# Patient Record
Sex: Female | Born: 2000 | Race: White | Marital: Single | Smoking: Never smoker
Health system: Southern US, Community
[De-identification: ages and names within clinical notes are randomized; demographics above are authoritative.]

## PROBLEM LIST (undated history)

## (undated) DIAGNOSIS — F419 Anxiety disorder, unspecified: Secondary | ICD-10-CM

## (undated) DIAGNOSIS — F4310 Post-traumatic stress disorder, unspecified: Secondary | ICD-10-CM

## (undated) DIAGNOSIS — G43909 Migraine, unspecified, not intractable, without status migrainosus: Secondary | ICD-10-CM

## (undated) HISTORY — DX: Migraine, unspecified, not intractable, without status migrainosus: G43.909

## (undated) HISTORY — DX: Post-traumatic stress disorder, unspecified: F43.10

## (undated) HISTORY — DX: Anxiety disorder, unspecified: F41.9

## (undated) HISTORY — PX: NO PAST SURGERIES: SHX2092

---
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
## 2019-05-31 DIAGNOSIS — L738 Other specified follicular disorders: Secondary | ICD-10-CM | POA: Diagnosis not present

## 2019-05-31 DIAGNOSIS — L700 Acne vulgaris: Secondary | ICD-10-CM | POA: Diagnosis not present

## 2019-08-06 DIAGNOSIS — M542 Cervicalgia: Secondary | ICD-10-CM | POA: Diagnosis not present

## 2019-08-29 DIAGNOSIS — Z23 Encounter for immunization: Secondary | ICD-10-CM | POA: Diagnosis not present

## 2019-08-29 DIAGNOSIS — Z79899 Other long term (current) drug therapy: Secondary | ICD-10-CM | POA: Diagnosis not present

## 2019-08-29 DIAGNOSIS — L700 Acne vulgaris: Secondary | ICD-10-CM | POA: Diagnosis not present

## 2019-09-30 DIAGNOSIS — L700 Acne vulgaris: Secondary | ICD-10-CM | POA: Diagnosis not present

## 2019-09-30 DIAGNOSIS — Z79899 Other long term (current) drug therapy: Secondary | ICD-10-CM | POA: Diagnosis not present

## 2019-10-31 DIAGNOSIS — Z03818 Encounter for observation for suspected exposure to other biological agents ruled out: Secondary | ICD-10-CM | POA: Diagnosis not present

## 2019-10-31 DIAGNOSIS — Z20828 Contact with and (suspected) exposure to other viral communicable diseases: Secondary | ICD-10-CM | POA: Diagnosis not present

## 2019-11-04 DIAGNOSIS — L700 Acne vulgaris: Secondary | ICD-10-CM | POA: Diagnosis not present

## 2019-11-04 DIAGNOSIS — Z79899 Other long term (current) drug therapy: Secondary | ICD-10-CM | POA: Diagnosis not present

## 2019-12-09 DIAGNOSIS — Z79899 Other long term (current) drug therapy: Secondary | ICD-10-CM | POA: Diagnosis not present

## 2019-12-09 DIAGNOSIS — L700 Acne vulgaris: Secondary | ICD-10-CM | POA: Diagnosis not present

## 2019-12-09 DIAGNOSIS — L308 Other specified dermatitis: Secondary | ICD-10-CM | POA: Diagnosis not present

## 2020-01-13 ENCOUNTER — Ambulatory Visit: Payer: BC Managed Care – PPO | Attending: Internal Medicine

## 2020-01-13 DIAGNOSIS — Z79899 Other long term (current) drug therapy: Secondary | ICD-10-CM | POA: Diagnosis not present

## 2020-01-13 DIAGNOSIS — Z23 Encounter for immunization: Secondary | ICD-10-CM

## 2020-01-13 DIAGNOSIS — L700 Acne vulgaris: Secondary | ICD-10-CM | POA: Diagnosis not present

## 2020-01-13 NOTE — Progress Notes (Signed)
   Covid-19 Vaccination Clinic  Name:  Nicole Caldwell    MRN: [id number] DOB: 2000-11-16  01/13/2020  Ms. Nicole Caldwell was observed post Covid-19 immunization for 15 minutes without incident. She was provided with Vaccine Information Sheet and instruction to access the V-Safe system.   Ms. Nicole Caldwell was instructed to call 911 with any severe reactions post vaccine: Marland Kitchen Difficulty breathing  . Swelling of face and throat  . A fast heartbeat  . A bad rash all over body  . Dizziness and weakness   Immunizations Administered    Name Date Dose VIS Date Route   Pfizer COVID-19 Vaccine 01/13/2020  1:38 PM 0.3 mL 09/27/2019 Intramuscular   Manufacturer: [employer], [employer]   Lot: [id number]   NDC: [address]-8

## 2020-01-21 DIAGNOSIS — Z0000 Encounter for general adult medical examination without abnormal findings: Secondary | ICD-10-CM | POA: Diagnosis not present

## 2020-01-21 DIAGNOSIS — Z13220 Encounter for screening for lipoid disorders: Secondary | ICD-10-CM | POA: Diagnosis not present

## 2020-02-04 ENCOUNTER — Ambulatory Visit: Payer: BC Managed Care – PPO | Attending: Internal Medicine

## 2020-02-04 DIAGNOSIS — Z23 Encounter for immunization: Secondary | ICD-10-CM

## 2020-02-04 NOTE — Progress Notes (Signed)
   Covid-19 Vaccination Clinic  Name:  Nicole Caldwell    MRN: [id number] DOB: [birth year]  02/04/2020  Ms. Nicole Caldwell was observed post Covid-19 immunization for 15 minutes without incident. She was provided with Vaccine Information Sheet and instruction to access the V-Safe system.   Ms. Nicole Caldwell was instructed to call 911 with any severe reactions post vaccine: Marland Kitchen Difficulty breathing  . Swelling of face and throat  . A fast heartbeat  . A bad rash all over body  . Dizziness and weakness   Immunizations Administered    Name Date Dose VIS Date Route   Pfizer COVID-19 Vaccine 02/04/2020  2:18 PM 0.3 mL 12/11/2018 Intramuscular   Manufacturer: [employer], [employer]   Lot: [id number]   NDC: [address]-7

## 2020-02-17 DIAGNOSIS — Z79899 Other long term (current) drug therapy: Secondary | ICD-10-CM | POA: Diagnosis not present

## 2020-02-17 DIAGNOSIS — L700 Acne vulgaris: Secondary | ICD-10-CM | POA: Diagnosis not present

## 2020-03-19 DIAGNOSIS — Z79899 Other long term (current) drug therapy: Secondary | ICD-10-CM | POA: Diagnosis not present

## 2020-03-19 DIAGNOSIS — L700 Acne vulgaris: Secondary | ICD-10-CM | POA: Diagnosis not present

## 2020-04-21 DIAGNOSIS — Z79899 Other long term (current) drug therapy: Secondary | ICD-10-CM | POA: Diagnosis not present

## 2020-04-21 DIAGNOSIS — L700 Acne vulgaris: Secondary | ICD-10-CM | POA: Diagnosis not present

## 2020-05-21 DIAGNOSIS — L700 Acne vulgaris: Secondary | ICD-10-CM | POA: Diagnosis not present

## 2020-05-25 DIAGNOSIS — Z113 Encounter for screening for infections with a predominantly sexual mode of transmission: Secondary | ICD-10-CM | POA: Diagnosis not present

## 2020-05-25 DIAGNOSIS — Z6821 Body mass index (BMI) 21.0-21.9, adult: Secondary | ICD-10-CM | POA: Diagnosis not present

## 2020-05-25 DIAGNOSIS — N926 Irregular menstruation, unspecified: Secondary | ICD-10-CM | POA: Diagnosis not present

## 2020-05-25 DIAGNOSIS — Z01419 Encounter for gynecological examination (general) (routine) without abnormal findings: Secondary | ICD-10-CM | POA: Diagnosis not present

## 2020-06-02 DIAGNOSIS — Z3202 Encounter for pregnancy test, result negative: Secondary | ICD-10-CM | POA: Diagnosis not present

## 2020-06-02 DIAGNOSIS — Z3046 Encounter for surveillance of implantable subdermal contraceptive: Secondary | ICD-10-CM | POA: Diagnosis not present

## 2020-06-04 ENCOUNTER — Other Ambulatory Visit: Payer: Self-pay

## 2020-06-04 ENCOUNTER — Emergency Department (HOSPITAL_COMMUNITY)
Admission: EM | Admit: 2020-06-04 | Discharge: 2020-06-04 | Disposition: A | Discharge status: Home / Self Care | Payer: BC Managed Care – PPO | Attending: Emergency Medicine | Admitting: Emergency Medicine

## 2020-06-04 ENCOUNTER — Encounter (HOSPITAL_COMMUNITY): Payer: Self-pay | Admitting: Emergency Medicine

## 2020-06-04 DIAGNOSIS — R110 Nausea: Secondary | ICD-10-CM | POA: Insufficient documentation

## 2020-06-04 DIAGNOSIS — I498 Other specified cardiac arrhythmias: Secondary | ICD-10-CM | POA: Diagnosis not present

## 2020-06-04 DIAGNOSIS — R42 Dizziness and giddiness: Secondary | ICD-10-CM | POA: Diagnosis not present

## 2020-06-04 DIAGNOSIS — R001 Bradycardia, unspecified: Secondary | ICD-10-CM | POA: Diagnosis not present

## 2020-06-04 MED ORDER — MECLIZINE HCL 25 MG PO TABS: 25.0000 mg | ORAL_TABLET | Freq: Three times a day (TID) | ORAL | 0 refills | Status: AC | PRN

## 2020-06-04 MED ORDER — MECLIZINE HCL 25 MG PO TABS
50.0000 mg | ORAL_TABLET | Freq: Once | ORAL | Status: AC
  Administered 2020-06-04: 50 mg via ORAL
  Filled 2020-06-04: qty 2

## 2020-06-04 NOTE — ED Triage Notes (Signed)
Patient reports sent from [facility] for evaluation of nystagmus. States dizziness upon standing and ambulating this morning with nausea. States nexplanon replaced x2 days ago. Denies head injury and headache.

## 2020-06-04 NOTE — ED Provider Notes (Signed)
[facility]-EMERGENCY DEPT Provider Note   CSN: [id number] Arrival date & time: 06/04/20  1132     History Chief Complaint  Patient presents with  . Dizziness    Genavie Johnanna Schneiders is a 19 y.o. female.  HPI Patient is 19 year old female presented today with sudden onset of intermittent dizziness that occurs worse when she looks left began this morning when she got out of bed.  She states that she does have associated nausea when she is experiencing the dizziness.  She describes a sensation as the room spinning.  She states that she has had no episodes of vomiting no abdominal pain no headache lightheadedness or near syncope.  She states that she has had no head trauma denies any vision changes.  Per her mother who is at bedside patient was seen at urgent care earlier today and was sent to emergency department for further evaluation.  Per mother she was sent because of some horizontal nystagmus that was noted on exam.  Per mother patient also has chronic sinus issues.  Patient denies any focal weakness.  Mother denies any slurred speech or confusion.     History reviewed. No pertinent past medical history.  There are no problems to display for this patient.   History reviewed. No pertinent surgical history.   OB History   No obstetric history on file.     No family history on file.  Social History   Tobacco Use  . Smoking status: Not on file  Substance Use Topics  . Alcohol use: Not on file  . Drug use: Not on file    Home Medications Prior to Admission medications   Medication Sig Start Date End Date Taking? Authorizing Provider  meclizine (ANTIVERT) 25 MG tablet Take 1 tablet (25 mg total) by mouth 3 (three) times daily as needed for dizziness. 06/04/20   Gailen Shelter, PA    Allergies    Penicillins  Review of Systems   Review of Systems  Constitutional: Negative for chills and fever.  HENT: Negative for congestion.   Eyes:  Negative for pain.  Respiratory: Negative for cough and shortness of breath.   Cardiovascular: Negative for chest pain and leg swelling.  Gastrointestinal: Positive for nausea. Negative for abdominal pain, diarrhea and vomiting.  Genitourinary: Negative for dysuria.  Musculoskeletal: Negative for myalgias.  Skin: Negative for rash.  Neurological: Positive for dizziness. Negative for syncope, light-headedness, numbness and headaches.    Physical Exam Updated Vital Signs BP 112/72 (BP Location: Right Arm)   Pulse 82   Temp 98.6 F (37 C) (Oral)   Resp 18   Ht 5\' 4"  (1.626 m)   Wt 52.4 kg   LMP 05/14/2020   SpO2 100%   BMI 19.84 kg/m   Physical Exam Vitals and nursing note reviewed.  Constitutional:      General: She is not in acute distress.    Comments: Pleasant 19 year old female appears stated age.  In no acute distress.  Sitting comfortably in bed  HENT:     Head: Normocephalic and atraumatic.     Nose: Nose normal.  Eyes:     General: No scleral icterus.    Comments: Faint left unilateral horizontal nystagmus  Cardiovascular:     Rate and Rhythm: Normal rate and regular rhythm.     Pulses: Normal pulses.     Heart sounds: Normal heart sounds.  Pulmonary:     Effort: Pulmonary effort is normal. No respiratory distress.  Breath sounds: No wheezing.  Abdominal:     Palpations: Abdomen is soft.     Tenderness: There is no abdominal tenderness.  Musculoskeletal:     Cervical back: Normal range of motion.     Right lower leg: No edema.     Left lower leg: No edema.  Skin:    General: Skin is warm and dry.     Capillary Refill: Capillary refill takes less than 2 seconds.  Neurological:     Mental Status: She is alert. Mental status is at baseline.     Comments: Alert and oriented to self, place, time and event.   Speech is fluent, clear without dysarthria or dysphasia.   Strength 5/5 in upper/lower extremities  Sensation intact in upper/lower extremities    Normal gait.  Negative Romberg. No pronator drift.  Normal finger-to-nose and feet tapping.  CN I not tested  CN II grossly intact visual fields bilaterally. Did not visualize posterior eye.   CN III, IV, VI PERRLA and EOMs intact bilaterally, mild leftward nystagmus with left gaze deviation.  No other abnormalities. CN V Intact sensation to sharp and light touch to the face  CN VII facial movements symmetric  CN VIII not tested  CN IX, X no uvula deviation, symmetric rise of soft palate  CN XI 5/5 SCM and trapezius strength bilaterally  CN XII Midline tongue protrusion, symmetric L/R movements   Psychiatric:        Mood and Affect: Mood normal.        Behavior: Behavior normal.     ED Results / Procedures / Treatments   Labs (all labs ordered are listed, but only abnormal results are displayed) Labs Reviewed - No data to display  EKG None  Radiology No results found.  Procedures Procedures (including critical care time)  Medications Ordered in ED Medications  meclizine (ANTIVERT) tablet 50 mg (50 mg Oral Given 06/04/20 1222)    ED Course  I have reviewed the triage vital signs and the nursing notes.  Pertinent labs & imaging results that were available during my care of the patient were reviewed by me and considered in my medical decision making (see chart for details).    MDM Rules/Calculators/A&P                          Patient is 19 year old female with no pertinent past medical history presented today for chief complaint of vertiginous symptoms.  She states that it came on abruptly this morning and is worsened by leftward gaze and states that she has some associated nausea.  She states she is otherwise feeling well.  She was sent from urgent care today over concerns for her dizziness.  She was noted by urgent care provider to have nystagmus.  Left physical exam is notable for mild unilateral horizontal leftward beating nystagmus with left gaze otherwise  normal exam.  She is well-appearing has extraocular movements that are intact and has minimal symptoms currently.  She states that they are much worse with looking left.  She is neurologically intact and vital signs within normal limits.  She has no evidence of lightheadedness or near syncope therefore I have very low suspicion for cardiac contrast or arrhythmia.  I have very low suspicion for stroke in this 19 year old female as she is healthy has no pertinent risk factors for this.  She does have some family history of stroke however given her symptoms are very consistent with BPPV  we will treat empirically with meclizine and discharged with same.  Will recommend follow-up with ear nose and throat.  She is given strict return precautions for any new or concerning symptoms including any neurologic abnormalities.  I have shared decision making her CHF mother and patient and they are agreeable to this plan.  Prefer not to stay for additional testing/imaging.  This is reasonable at this time.  --   The medical records were personally reviewed by myself. I personally reviewed all lab results and interpreted all imaging studies and either concurred with their official read or contacted radiology for clarification. Additional history obtained from old records/EMS/family members  This patient appears reasonably screened and I doubt any other medical condition requiring further workup, evaluation, or treatment in the ED at this time prior to discharge.   Patient's vitals are WNL apart from vital sign abnormalities discussed above, patient is in NAD, and able to ambulate in the ED at their baseline and able to tolerate PO.  Pain has been managed or a plan has been made for home management and has no complaints prior to discharge. Patient is comfortable with above plan and for discharge at this time. All questions were answered prior to disposition. Results from the ER workup discussed with the patient face to  face and all questions answered to the best of my ability. The patient is safe for discharge with strict return precautions. Patient appears safe for discharge with appropriate follow-up. Conveyed my impression with the patient and they voiced understanding and are agreeable to plan.   An After Visit Summary was printed and given to the patient.  Portions of this note were generated with Scientist, clinical (histocompatibility and immunogenetics). Dictation errors may occur despite best attempts at proofreading.    Final Clinical Impression(s) / ED Diagnoses Final diagnoses:  Vertigo    Rx / DC Orders ED Discharge Orders         Ordered    meclizine (ANTIVERT) 25 MG tablet  3 times daily PRN        06/04/20 1211           Gailen Shelter, [place] 06/04/20 1539    Jacalyn Lefevre, MD 06/05/20 [phone]

## 2020-06-04 NOTE — ED Notes (Signed)
PA at bedside in triage.

## 2020-06-04 NOTE — Discharge Instructions (Addendum)
You have a reassuringly normal neurologic exam today. 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  The treatment for this is meclizine. Please use as prescribed. I have also given you information on the Epley maneuver. Please attempt this maneuver at home.  Please drink plenty of water.  Continue to monitor your symptoms. If he began having a new or concerning symptoms such as those that we discussed please return immediately to the emergency department for reevaluation.

## 2020-06-04 NOTE — ED Notes (Signed)
Coming from [facility] with complaints of nystagmus and difficulty ambulating

## 2020-07-07 DIAGNOSIS — R42 Dizziness and giddiness: Secondary | ICD-10-CM | POA: Diagnosis not present

## 2020-07-07 DIAGNOSIS — H833X3 Noise effects on inner ear, bilateral: Secondary | ICD-10-CM | POA: Diagnosis not present

## 2020-07-07 DIAGNOSIS — H833X9 Noise effects on inner ear, unspecified ear: Secondary | ICD-10-CM | POA: Insufficient documentation
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
## 2020-09-16 ENCOUNTER — Encounter: Payer: Self-pay | Admitting: Neurology

## 2020-09-16 ENCOUNTER — Ambulatory Visit (INDEPENDENT_AMBULATORY_CARE_PROVIDER_SITE_OTHER): Payer: BC Managed Care – PPO | Admitting: Neurology

## 2020-09-16 ENCOUNTER — Other Ambulatory Visit: Payer: Self-pay

## 2020-09-16 ENCOUNTER — Telehealth: Payer: Self-pay | Admitting: Neurology

## 2020-09-16 VITALS — BP 118/71 | HR 74 | Ht 62.0 in | Wt 122.0 lb

## 2020-09-16 DIAGNOSIS — R42 Dizziness and giddiness: Secondary | ICD-10-CM

## 2020-09-16 DIAGNOSIS — R51 Headache with orthostatic component, not elsewhere classified: Secondary | ICD-10-CM

## 2020-09-16 DIAGNOSIS — R519 Headache, unspecified: Secondary | ICD-10-CM

## 2020-09-16 DIAGNOSIS — H539 Unspecified visual disturbance: Secondary | ICD-10-CM

## 2020-09-16 DIAGNOSIS — G43709 Chronic migraine without aura, not intractable, without status migrainosus: Secondary | ICD-10-CM

## 2020-09-16 DIAGNOSIS — G4484 Primary exertional headache: Secondary | ICD-10-CM

## 2020-09-16 DIAGNOSIS — H93A2 Pulsatile tinnitus, left ear: Secondary | ICD-10-CM

## 2020-09-16 MED ORDER — TOPIRAMATE 25 MG PO TABS: ORAL_TABLET | ORAL | 3 refills | Status: DC

## 2020-09-16 MED ORDER — RIZATRIPTAN BENZOATE 10 MG PO TBDP: 10.0000 mg | ORAL_TABLET | ORAL | 11 refills | Status: AC | PRN

## 2020-09-16 MED ORDER — ONDANSETRON 4 MG PO TBDP: 4.0000 mg | ORAL_TABLET | Freq: Three times a day (TID) | ORAL | 3 refills | Status: AC | PRN

## 2020-09-16 NOTE — Patient Instructions (Addendum)
MRI of the brain and MRA of the head - will call to schedule Blood work today Acute: Rizatriptan:Please take one tablet at the onset of your headache. If it does not improve the symptoms please take one additional tablet. Do not take more then 2 tablets in 24hrs. Do not take use more then 2 to 3 times in a week. Ondansetron for nausea. May take with Rizatriptan as well. Prevention: Topiramate  Topiramate tablets What is this medicine? TOPIRAMATE (toe PYRE a mate) is used to treat seizures in adults or children with epilepsy. It is also used for the prevention of migraine headaches. This medicine may be used for other purposes; ask your health care provider or pharmacist if you have questions. COMMON BRAND NAME(S): Topamax, Topiragen What should I tell my health care provider before I take this medicine? They need to know if you have any of these conditions:  bleeding disorders  kidney disease  lung or breathing disease, like asthma  suicidal thoughts, plans, or attempt; a previous suicide attempt by you or a family member  an unusual or allergic reaction to topiramate, other medicines, foods, dyes, or preservatives  pregnant or trying to get pregnant  breast-feeding How should I use this medicine? Take this medicine by mouth with a glass of water. Follow the directions on the prescription label. Do not cut, crush or chew this medicine. Swallow the tablets whole. You can take it with or without food. If it upsets your stomach, take it with food. Take your medicine at regular intervals. Do not take it more often than directed. Do not stop taking except on your doctor's advice. A special MedGuide will be given to you by the pharmacist with each prescription and refill. Be sure to read this information carefully each time. Talk to your pediatrician regarding the use of this medicine in children. While this drug may be prescribed for children as young as 502 years of age for selected  conditions, precautions do apply. Overdosage: If you think you have taken too much of this medicine contact a poison control center or emergency room at once. NOTE: This medicine is only for you. Do not share this medicine with others. What if I miss a dose? If you miss a dose, take it as soon as you can. If your next dose is to be taken in less than 6 hours, then do not take the missed dose. Take the next dose at your regular time. Do not take double or extra doses. What may interact with this medicine? This medicine may interact with the following medications:  acetazolamide  alcohol  antihistamines for allergy, cough, and cold  aspirin and aspirin-like medicines  atropine  birth control pills  certain medicines for anxiety or sleep  certain medicines for bladder problems like oxybutynin, tolterodine  certain medicines for depression like amitriptyline, fluoxetine, sertraline  certain medicines for seizures like carbamazepine, phenobarbital, phenytoin, primidone, valproic acid, zonisamide  certain medicines for stomach problems like dicyclomine, hyoscyamine  certain medicines for travel sickness like scopolamine  certain medicines for Parkinson's disease like benztropine, trihexyphenidyl  certain medicines that treat or prevent blood clots like warfarin, enoxaparin, dalteparin, apixaban, dabigatran, and rivaroxaban  digoxin  general anesthetics like halothane, isoflurane, methoxyflurane, propofol  hydrochlorothiazide  ipratropium  lithium  medicines that relax muscles for surgery  metformin  narcotic medicines for pain  NSAIDs, medicines for pain and inflammation, like ibuprofen or naproxen  phenothiazines like chlorpromazine, mesoridazine, prochlorperazine, thioridazine  pioglitazone This list may not  describe all possible interactions. Give your health care provider a list of all the medicines, herbs, non-prescription drugs, or dietary supplements you use.  Also tell them if you smoke, drink alcohol, or use illegal drugs. Some items may interact with your medicine. What should I watch for while using this medicine? Visit your doctor or health care professional for regular checks on your progress. Tell your health care professional if your symptoms do not start to get better or if they get worse. Do not stop taking except on your health care professional's advice. You may develop a severe reaction. Your health care professional will tell you how much medicine to take. Wear a medical ID bracelet or chain. Carry a card that describes your disease and details of your medicine and dosage times. This medicine can reduce the response of your body to heat or cold. Dress warm in cold weather and stay hydrated in hot weather. If possible, avoid extreme temperatures like saunas, hot tubs, very hot or cold showers, or activities that can cause dehydration such as vigorous exercise. Check with your health care professional if you have severe diarrhea, nausea, and vomiting, or if you sweat a lot. The loss of too much body fluid may make it dangerous for you to take this medicine. You may get drowsy or dizzy. Do not drive, use machinery, or do anything that needs mental alertness until you know how this medicine affects you. Do not stand up or sit up quickly, especially if you are an older patient. This reduces the risk of dizzy or fainting spells. Alcohol may interfere with the effect of this medicine. Avoid alcoholic drinks. Tell your health care professional right away if you have any change in your eyesight. Patients and their families should watch out for new or worsening depression or thoughts of suicide. Also watch out for sudden changes in feelings such as feeling anxious, agitated, panicky, irritable, hostile, aggressive, impulsive, severely restless, overly excited and hyperactive, or not being able to sleep. If this happens, especially at the beginning of  treatment or after a change in dose, call your healthcare professional. This medicine may cause serious skin reactions. They can happen weeks to months after starting the medicine. Contact your health care provider right away if you notice fevers or flu-like symptoms with a rash. The rash may be red or purple and then turn into blisters or peeling of the skin. Or, you might notice a red rash with swelling of the face, lips or lymph nodes in your neck or under your arms. Birth control may not work properly while you are taking this medicine. Talk to your health care professional about using an extra method of birth control. Women should inform their health care professional if they wish to become pregnant or think they might be pregnant. There is a potential for serious side effects and harm to an unborn child. Talk to your health care professional for more information. What side effects may I notice from receiving this medicine? Side effects that you should report to your doctor or health care professional as soon as possible:  allergic reactions like skin rash, itching or hives, swelling of the face, lips, or tongue  blood in the urine  changes in vision  confusion  loss of memory  pain in lower back or side  pain when urinating  redness, blistering, peeling or loosening of the skin, including inside the mouth  signs and symptoms of bleeding such as bloody or black, tarry stools;  red or dark brown urine; spitting up blood or brown material that looks like coffee grounds; red spots on the skin; unusual bruising or bleeding from the eyes, gums, or nose  signs and symptoms of increased acid in the body like breathing fast; fast heartbeat; headache; confusion; unusually weak or tired; nausea, vomiting  suicidal thoughts, mood changes  trouble speaking or understanding  unusual sweating  unusually weak or tired Side effects that usually do not require medical attention (report to your  doctor or health care professional if they continue or are bothersome):  dizziness  drowsiness  fever  loss of appetite  nausea, vomiting  pain, tingling, numbness in the hands or feet  stomach pain  tiredness  upset stomach This list may not describe all possible side effects. Call your doctor for medical advice about side effects. You may report side effects to FDA at 1-800-FDA-1088. Where should I keep my medicine? Keep out of the reach of children. Store at room temperature between 15 and 30 degrees C (59 and 86 degrees F). Throw away any unused medicine after the expiration date. NOTE: This sheet is a summary. It may not cover all possible information. If you have questions about this medicine, talk to your doctor, pharmacist, or health care provider.  2020 Elsevier/Gold Standard (2019-05-02 15:07:20) Ondansetron oral dissolving tablet What is this medicine? ONDANSETRON (on DAN se tron) is used to treat nausea and vomiting caused by chemotherapy. It is also used to prevent or treat nausea and vomiting after surgery. This medicine may be used for other purposes; ask your health care provider or pharmacist if you have questions. COMMON BRAND NAME(S): Zofran ODT What should I tell my health care provider before I take this medicine? They need to know if you have any of these conditions:  heart disease  history of irregular heartbeat  liver disease  low levels of magnesium or potassium in the blood  an unusual or allergic reaction to ondansetron, granisetron, other medicines, foods, dyes, or preservatives  pregnant or trying to get pregnant  breast-feeding How should I use this medicine? These tablets are made to dissolve in the mouth. Do not try to push the tablet through the foil backing. With dry hands, peel away the foil backing and gently remove the tablet. Place the tablet in the mouth and allow it to dissolve, then swallow. While you may take these tablets with  water, it is not necessary to do so. Talk to your pediatrician regarding the use of this medicine in children. Special care may be needed. Overdosage: If you think you have taken too much of this medicine contact a poison control center or emergency room at once. NOTE: This medicine is only for you. Do not share this medicine with others. What if I miss a dose? If you miss a dose, take it as soon as you can. If it is almost time for your next dose, take only that dose. Do not take double or extra doses. What may interact with this medicine? Do not take this medicine with any of the following medications:  apomorphine  certain medicines for fungal infections like fluconazole, itraconazole, ketoconazole, posaconazole, voriconazole  cisapride  dronedarone  pimozide  thioridazine This medicine may also interact with the following medications:  carbamazepine  certain medicines for depression, anxiety, or psychotic disturbances  fentanyl  linezolid  MAOIs like Carbex, Eldepryl, Marplan, Nardil, and Parnate  methylene blue (injected into a vein)  other medicines that prolong the QT interval (  cause an abnormal heart rhythm) like dofetilide, ziprasidone  phenytoin  rifampicin  tramadol This list may not describe all possible interactions. Give your health care provider a list of all the medicines, herbs, non-prescription drugs, or dietary supplements you use. Also tell them if you smoke, drink alcohol, or use illegal drugs. Some items may interact with your medicine. What should I watch for while using this medicine? Check with your doctor or health care professional as soon as you can if you have any sign of an allergic reaction. What side effects may I notice from receiving this medicine? Side effects that you should report to your doctor or health care professional as soon as possible:  allergic reactions like skin rash, itching or hives, swelling of the face, lips, or  tongue  breathing problems  confusion  dizziness  fast or irregular heartbeat  feeling faint or lightheaded, falls  fever and chills  loss of balance or coordination  seizures  sweating  swelling of the hands and feet  tightness in the chest  tremors  unusually weak or tired Side effects that usually do not require medical attention (report to your doctor or health care professional if they continue or are bothersome):  constipation or diarrhea  headache This list may not describe all possible side effects. Call your doctor for medical advice about side effects. You may report side effects to FDA at 1-800-FDA-1088. Where should I keep my medicine? Keep out of the reach of children. Store between 2 and 30 degrees C (36 and 86 degrees F). Throw away any unused medicine after the expiration date. NOTE: This sheet is a summary. It may not cover all possible information. If you have questions about this medicine, talk to your doctor, pharmacist, or health care provider.  2020 Elsevier/Gold Standard (2018-09-25 07:14:10) Rizatriptan disintegrating tablets What is this medicine? RIZATRIPTAN (rye za TRIP tan) is used to treat migraines with or without aura. An aura is a strange feeling or visual disturbance that warns you of an attack. It is not used to prevent migraines. This medicine may be used for other purposes; ask your health care provider or pharmacist if you have questions. COMMON BRAND NAME(S): Maxalt-MLT What should I tell my health care provider before I take this medicine? They need to know if you have any of these conditions:  cigarette smoker  circulation problems in fingers and toes  diabetes  heart disease  high blood pressure  high cholesterol  history of irregular heartbeat  history of stroke  kidney disease  liver disease  stomach or intestine problems  an unusual or allergic reaction to rizatriptan, other medicines, foods, dyes, or  preservatives  pregnant or trying to get pregnant  breast-feeding How should I use this medicine? Take this medicine by mouth. Follow the directions on the prescription label. Leave the tablet in the sealed blister pack until you are ready to take it. With dry hands, open the blister and gently remove the tablet. If the tablet breaks or crumbles, throw it away and take a new tablet out of the blister pack. Place the tablet in the mouth and allow it to dissolve, and then swallow. Do not cut, crush, or chew this medicine. You do not need water to take this medicine. Do not take it more often than directed. Talk to your pediatrician regarding the use of this medicine in children. While this drug may be prescribed for children as young as 6 years for selected conditions, precautions do apply.  Overdosage: If you think you have taken too much of this medicine contact a poison control center or emergency room at once. NOTE: This medicine is only for you. Do not share this medicine with others. What if I miss a dose? This does not apply. This medicine is not for regular use. What may interact with this medicine? Do not take this medicine with any of the following medicines:  certain medicines for migraine headache like almotriptan, eletriptan, frovatriptan, naratriptan, rizatriptan, sumatriptan, zolmitriptan  ergot alkaloids like dihydroergotamine, ergonovine, ergotamine, methylergonovine  MAOIs like Carbex, Eldepryl, Marplan, Nardil, and Parnate This medicine may also interact with the following medications:  certain medicines for depression, anxiety, or psychotic disorders  propranolol This list may not describe all possible interactions. Give your health care provider a list of all the medicines, herbs, non-prescription drugs, or dietary supplements you use. Also tell them if you smoke, drink alcohol, or use illegal drugs. Some items may interact with your medicine. What should I watch for while  using this medicine? Visit your healthcare professional for regular checks on your progress. Tell your healthcare professional if your symptoms do not start to get better or if they get worse. You may get drowsy or dizzy. Do not drive, use machinery, or do anything that needs mental alertness until you know how this medicine affects you. Do not stand up or sit up quickly, especially if you are an older patient. This reduces the risk of dizzy or fainting spells. Alcohol may interfere with the effect of this medicine. Your mouth may get dry. Chewing sugarless gum or sucking hard candy and drinking plenty of water may help. Contact your healthcare professional if the problem does not go away or is severe. If you take migraine medicines for 10 or more days a month, your migraines may get worse. Keep a diary of headache days and medicine use. Contact your healthcare professional if your migraine attacks occur more frequently. What side effects may I notice from receiving this medicine? Side effects that you should report to your doctor or health care professional as soon as possible:  allergic reactions like skin rash, itching or hives, swelling of the face, lips, or tongue  chest pain or chest tightness  signs and symptoms of a dangerous change in heartbeat or heart rhythm like chest pain; dizziness; fast, irregular heartbeat; palpitations; feeling faint or lightheaded; falls; breathing problems  signs and symptoms of a stroke like changes in vision; confusion; trouble speaking or understanding; severe headaches; sudden numbness or weakness of the face, arm or leg; trouble walking; dizziness; loss of balance or coordination  signs and symptoms of serotonin syndrome like irritable; confusion; diarrhea; fast or irregular heartbeat; muscle twitching; stiff muscles; trouble walking; sweating; high fever; seizures; chills; vomiting Side effects that usually do not require medical attention (report to your  doctor or health care professional if they continue or are bothersome):  diarrhea  dizziness  drowsiness  dry mouth  headache  nausea, vomiting  pain, tingling, numbness in the hands or feet  stomach pain This list may not describe all possible side effects. Call your doctor for medical advice about side effects. You may report side effects to FDA at 1-800-FDA-1088. Where should I keep my medicine? Keep out of the reach of children. Store at room temperature between 15 and 30 degrees C (59 and 86 degrees F). Protect from light and moisture. Throw away any unused medicine after the expiration date. NOTE: This sheet is a summary. It  may not cover all possible information. If you have questions about this medicine, talk to your doctor, pharmacist, or health care provider.  2020 Elsevier/Gold Standard (2018-04-17 14:58:08)

## 2020-09-16 NOTE — Telephone Encounter (Signed)
[employer] Berkley Harvey: [id number] (exp. 09/16/20 to 11/14/20) order sent to [facility]. They will reach out to the patient to schedule.

## 2020-09-17 ENCOUNTER — Telehealth: Payer: Self-pay | Admitting: *Deleted

## 2020-09-17 LAB — TSH: TSH: 1.02 u[IU]/mL (ref 0.450–4.500)

## 2020-09-17 LAB — COMPREHENSIVE METABOLIC PANEL
ALT: 13 IU/L (ref 0–32)
AST: 17 IU/L (ref 0–40)
Albumin/Globulin Ratio: 1.8 (ref 1.2–2.2)
Albumin: 4.5 g/dL (ref 3.9–5.0)
Alkaline Phosphatase: 48 IU/L (ref 42–106)
BUN/Creatinine Ratio: 10 (ref 9–23)
BUN: 7 mg/dL (ref 6–20)
Bilirubin Total: 0.4 mg/dL (ref 0.0–1.2)
CO2: 22 mmol/L (ref 20–29)
Calcium: 9.4 mg/dL (ref 8.7–10.2)
Chloride: 105 mmol/L (ref 96–106)
Creatinine, Ser: 0.67 mg/dL (ref 0.57–1.00)
GFR calc Af Amer: 146 mL/min/{1.73_m2} (ref >59)
GFR calc non Af Amer: 127 mL/min/{1.73_m2} (ref >59)
Globulin, Total: 2.5 g/dL (ref 1.5–4.5)
Glucose: 92 mg/dL (ref 65–99)
Potassium: 4.2 mmol/L (ref 3.5–5.2)
Sodium: 138 mmol/L (ref 134–144)
Total Protein: 7 g/dL (ref 6.0–8.5)

## 2020-09-17 LAB — CBC WITH DIFFERENTIAL/PLATELET
Basophils Absolute: 0 10*3/uL (ref 0.0–0.2)
Basos: 1 %
EOS (ABSOLUTE): 0.1 10*3/uL (ref 0.0–0.4)
Eos: 1 %
Hematocrit: 40.2 % (ref 34.0–46.6)
Hemoglobin: 13.4 g/dL (ref 11.1–15.9)
Immature Grans (Abs): 0 10*3/uL (ref 0.0–0.1)
Immature Granulocytes: 0 %
Lymphocytes Absolute: 1.9 10*3/uL (ref 0.7–3.1)
Lymphs: 32 %
MCH: 29.6 pg (ref 26.6–33.0)
MCHC: 33.3 g/dL (ref 31.5–35.7)
MCV: 89 fL (ref 79–97)
Monocytes Absolute: 0.3 10*3/uL (ref 0.1–0.9)
Monocytes: 5 %
Neutrophils Absolute: 3.8 10*3/uL (ref 1.4–7.0)
Neutrophils: 61 %
Platelets: 217 10*3/uL (ref 150–450)
RBC: 4.53 x10E6/uL (ref 3.77–5.28)
RDW: 11.8 % (ref 11.7–15.4)
WBC: 6.1 10*3/uL (ref 3.4–10.8)

## 2020-09-17 NOTE — Telephone Encounter (Signed)
Called patient and advised she stop topiramate, wait a few days and e mail Dr Lucia Gaskins. I sent her my chart code to her e mail at her request. Patient verbalized understanding, appreciation.

## 2020-09-17 NOTE — Telephone Encounter (Signed)
Spoke with patient and informed her blood work is normal. She  verbalized understanding, appreciation. She then stated she took topiramate last night for 1st time. She reported feeling out of it, eyes spinning, extreme  nausea, itchy rash on chest, anxiety, waves of emotions up and down. She stated she doesn't tolerate medications well, wants to know what to do.  I advised will let Dr Lucia Gaskins know and call her back today. Patient verbalized understanding, appreciation.

## 2020-09-17 NOTE — Telephone Encounter (Signed)
I would stop the Topiramate. When the side effects have resolved in a few days, have her email me and we can try another medication(stress the email part). thanks

## 2020-09-30 ENCOUNTER — Other Ambulatory Visit: Payer: Self-pay | Admitting: Neurology

## 2020-09-30 ENCOUNTER — Telehealth: Payer: Self-pay | Admitting: Neurology

## 2020-09-30 MED ORDER — ALPRAZOLAM 0.25 MG PO TABS
ORAL_TABLET | ORAL | 0 refills | Status: AC
Start: 2020-09-30 — End: ?

## 2020-09-30 NOTE — Telephone Encounter (Signed)
Pt called stating that she has decided to go ahead and have the sedative called in for her to take Friday the day she has the MRI scheduled at [facility]. Please call in to [facility] on [address]

## 2020-09-30 NOTE — Telephone Encounter (Signed)
Spoke with patient and advised Xanax has been ordered. We discussed instructions, take 1-2 tablets 30-60 minutes before MRI. May repeat if needed. Do not drive as this may cause sedation. Pt verbalized understanding and stated her mother would be driving her. Pt verbalized appreciation for the call.

## 2020-09-30 NOTE — Telephone Encounter (Signed)
Xanax prescribed, it may cause sedation, do not drive. thanks

## 2020-10-02 ENCOUNTER — Other Ambulatory Visit: Payer: Self-pay

## 2020-10-02 ENCOUNTER — Ambulatory Visit
Admission: RE | Admit: 2020-10-02 | Discharge: 2020-10-02 | Disposition: A | Discharge status: Home / Self Care | Payer: BC Managed Care – PPO | Source: Ambulatory Visit | Attending: Neurology | Admitting: Neurology

## 2020-10-02 DIAGNOSIS — R42 Dizziness and giddiness: Secondary | ICD-10-CM

## 2020-10-02 DIAGNOSIS — G43709 Chronic migraine without aura, not intractable, without status migrainosus: Secondary | ICD-10-CM | POA: Diagnosis not present

## 2020-10-02 DIAGNOSIS — H539 Unspecified visual disturbance: Secondary | ICD-10-CM

## 2020-10-02 DIAGNOSIS — R51 Headache with orthostatic component, not elsewhere classified: Secondary | ICD-10-CM

## 2020-10-02 DIAGNOSIS — G4484 Primary exertional headache: Secondary | ICD-10-CM

## 2020-10-02 DIAGNOSIS — H93A2 Pulsatile tinnitus, left ear: Secondary | ICD-10-CM | POA: Diagnosis not present

## 2020-10-02 DIAGNOSIS — R519 Headache, unspecified: Secondary | ICD-10-CM

## 2020-10-02 MED ORDER — GADOBENATE DIMEGLUMINE 529 MG/ML IV SOLN
10.0000 mL | Freq: Once | INTRAVENOUS | Status: AC | PRN
  Administered 2020-10-02: 10 mL via INTRAVENOUS

## 2020-10-04 ENCOUNTER — Telehealth: Payer: Self-pay | Admitting: Neurology

## 2020-10-04 NOTE — Telephone Encounter (Signed)
Arteries of the brain appear normal. But We need to order a CT of the blood vessels. The finding on MRA(see below) is likely artifact but we should order a CTA to make sure thanks. If patient is ok with it let me know and I can order.  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  This could be artifact but consider a CT angiogram to further evaluate to rule out focal stenosis or dissection.   Arteries appear normal.

## 2020-10-05 ENCOUNTER — Other Ambulatory Visit: Payer: Self-pay | Admitting: Neurology

## 2020-10-05 DIAGNOSIS — I7771 Dissection of carotid artery: Secondary | ICD-10-CM

## 2020-10-05 NOTE — Telephone Encounter (Signed)
Spoke with patient and discussed MRA results with patient. She is amenable to having CT of vessels completed to r/o artifact vs abnormality of the area of carotid artery mentioned on MRA. Pt verbalized appreciation for the call.

## 2020-10-06 ENCOUNTER — Telehealth: Payer: Self-pay | Admitting: Neurology

## 2020-10-06 NOTE — Telephone Encounter (Signed)
[employer] Berkley Harvey: [id number] (exp. 10/06/20 to 12/04/20) order sent to [facility]. They will reach out to the patient to schedule.

## 2020-10-13 DIAGNOSIS — R42 Dizziness and giddiness: Secondary | ICD-10-CM | POA: Diagnosis not present

## 2020-10-13 DIAGNOSIS — R519 Headache, unspecified: Secondary | ICD-10-CM | POA: Diagnosis not present

## 2020-10-13 DIAGNOSIS — R109 Unspecified abdominal pain: Secondary | ICD-10-CM | POA: Diagnosis not present

## 2020-10-21 ENCOUNTER — Other Ambulatory Visit: Payer: BC Managed Care – PPO

## 2020-11-05 ENCOUNTER — Ambulatory Visit
Admission: RE | Admit: 2020-11-05 | Discharge: 2020-11-05 | Disposition: A | Discharge status: Home / Self Care | Payer: BC Managed Care – PPO | Source: Ambulatory Visit | Attending: Neurology | Admitting: Neurology

## 2020-11-05 DIAGNOSIS — I7771 Dissection of carotid artery: Secondary | ICD-10-CM

## 2020-11-05 DIAGNOSIS — R930 Abnormal findings on diagnostic imaging of skull and head, not elsewhere classified: Secondary | ICD-10-CM | POA: Diagnosis not present

## 2020-11-05 MED ORDER — IOPAMIDOL (ISOVUE-370) INJECTION 76%
75.0000 mL | Freq: Once | INTRAVENOUS | Status: AC | PRN
  Administered 2020-11-05: 75 mL via INTRAVENOUS

## 2020-12-08 ENCOUNTER — Other Ambulatory Visit: Payer: Self-pay | Admitting: Neurology

## 2020-12-24 DIAGNOSIS — R053 Chronic cough: Secondary | ICD-10-CM | POA: Diagnosis not present

## 2020-12-24 DIAGNOSIS — R059 Cough, unspecified: Secondary | ICD-10-CM | POA: Diagnosis not present

## 2020-12-28 DIAGNOSIS — Z20822 Contact with and (suspected) exposure to covid-19: Secondary | ICD-10-CM | POA: Diagnosis not present

## 2020-12-28 DIAGNOSIS — R059 Cough, unspecified: Secondary | ICD-10-CM | POA: Diagnosis not present

## 2020-12-28 DIAGNOSIS — J039 Acute tonsillitis, unspecified: Secondary | ICD-10-CM | POA: Diagnosis not present

## 2020-12-28 DIAGNOSIS — J0100 Acute maxillary sinusitis, unspecified: Secondary | ICD-10-CM | POA: Diagnosis not present

## 2021-01-12 ENCOUNTER — Encounter: Payer: Self-pay | Admitting: Adult Health

## 2021-01-12 ENCOUNTER — Ambulatory Visit (INDEPENDENT_AMBULATORY_CARE_PROVIDER_SITE_OTHER): Payer: BC Managed Care – PPO | Admitting: Adult Health

## 2021-01-12 VITALS — BP 104/65 | HR 88 | Ht 62.0 in | Wt 121.0 lb

## 2021-01-12 DIAGNOSIS — G43709 Chronic migraine without aura, not intractable, without status migrainosus: Secondary | ICD-10-CM | POA: Diagnosis not present
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
## 2021-01-12 NOTE — Patient Instructions (Signed)
Your Plan:  Start Nortriptyline 10 mg at bedtime Use maxalt at the onset of migraine- repeat in 2 hours if needed If your symptoms worsen or you develop new symptoms please let Nicole Caldwell know.   Thank you for coming to see Nicole Caldwell at [facility]. I hope we have been able to provide you high quality care today.  You may receive a patient satisfaction survey over the next few weeks. We would appreciate your feedback and comments so that we may continue to improve ourselves and the health of our patients.  Nortriptyline capsules What is this medicine? NORTRIPTYLINE (nor TRIP ti leen) is used to treat depression. This medicine may be used for other purposes; ask your health care provider or pharmacist if you have questions. COMMON BRAND NAME(S): Aventyl, Pamelor What should I tell my health care provider before I take this medicine? They need to know if you have any of these conditions:  bipolar disorder  Brugada syndrome  difficulty passing urine  glaucoma  heart disease  if you drink alcohol  liver disease  schizophrenia  seizures  suicidal thoughts, plans or attempt; a previous suicide attempt by you or a family member  thyroid disease  an unusual or allergic reaction to nortriptyline, other tricyclic antidepressants, other medicines, foods, dyes, or preservatives  pregnant or trying to get pregnant  breast-feeding How should I use this medicine? Take this medicine by mouth with a glass of water. Follow the directions on the prescription label. Take your doses at regular intervals. Do not take it more often than directed. Do not stop taking this medicine suddenly except upon the advice of your doctor. Stopping this medicine too quickly may cause serious side effects or your condition may worsen. A special MedGuide will be given to you by the pharmacist with each prescription and refill. Be sure to read this information carefully each time. Talk to your  pediatrician regarding the use of this medicine in children. Special care may be needed. Overdosage: If you think you have taken too much of this medicine contact a poison control center or emergency room at once. NOTE: This medicine is only for you. Do not share this medicine with others. What if I miss a dose? If you miss a dose, take it as soon as you can. If it is almost time for your next dose, take only that dose. Do not take double or extra doses. What may interact with this medicine? Do not take this medicine with any of the following medications:  cisapride  dronedarone  linezolid  MAOIs like Carbex, Eldepryl, Marplan, Nardil, and Parnate  methylene blue (injected into a vein)  pimozide  thioridazine This medicine may also interact with the following medications:  alcohol  antihistamines for allergy, cough, and cold  atropine  certain medicines for bladder problems like oxybutynin, tolterodine  certain medicines for depression like amitriptyline, fluoxetine, sertraline  certain medicines for Parkinson's disease like benztropine, trihexyphenidyl  certain medicines for stomach problems like dicyclomine, hyoscyamine  certain medicines for travel sickness like scopolamine  chlorpropamide  cimetidine  ipratropium  other medicines that prolong the QT interval (an abnormal heart rhythm) like dofetilide  other medicines that can cause serotonin syndrome like St. John's Wort, fentanyl, lithium, tramadol, tryptophan, buspirone, and some medicines for headaches like sumatriptan or rizatriptan  quinidine  reserpine  thyroid medicine This list may not describe all possible interactions. Give your health care provider a list of all the medicines, herbs, non-prescription drugs, or dietary supplements  you use. Also tell them if you smoke, drink alcohol, or use illegal drugs. Some items may interact with your medicine. What should I watch for while using this  medicine? Tell your doctor if your symptoms do not get better or if they get worse. Visit your doctor or health care professional for regular checks on your progress. Because it may take several weeks to see the full effects of this medicine, it is important to continue your treatment as prescribed by your doctor. Patients and their families should watch out for new or worsening thoughts of suicide or depression. Also watch out for sudden changes in feelings such as feeling anxious, agitated, panicky, irritable, hostile, aggressive, impulsive, severely restless, overly excited and hyperactive, or not being able to sleep. If this happens, especially at the beginning of treatment or after a change in dose, call your health care professional. Bonita Quin may get drowsy or dizzy. Do not drive, use machinery, or do anything that needs mental alertness until you know how this medicine affects you. Do not stand or sit up quickly, especially if you are an older patient. This reduces the risk of dizzy or fainting spells. Alcohol may interfere with the effect of this medicine. Avoid alcoholic drinks. Do not treat yourself for coughs, colds, or allergies without asking your doctor or health care professional for advice. Some ingredients can increase possible side effects. Your mouth may get dry. Chewing sugarless gum or sucking hard candy, and drinking plenty of water may help. Contact your doctor if the problem does not go away or is severe. This medicine may cause dry eyes and blurred vision. If you wear contact lenses you may feel some discomfort. Lubricating drops may help. See your eye doctor if the problem does not go away or is severe. This medicine can cause constipation. Try to have a bowel movement at least every 2 to 3 days. If you do not have a bowel movement for 3 days, call your doctor or health care professional. This medicine can make you more sensitive to the sun. Keep out of the sun. If you cannot avoid being  in the sun, wear protective clothing and use sunscreen. Do not use sun lamps or tanning beds/booths. What side effects may I notice from receiving this medicine? Side effects that you should report to your doctor or health care professional as soon as possible:  allergic reactions like skin rash, itching or hives, swelling of the face, lips, or tongue  anxious  breathing problems  changes in vision  confusion  elevated mood, decreased need for sleep, racing thoughts, impulsive behavior  eye pain  fast, irregular heartbeat  feeling faint or lightheaded, falls  feeling agitated, angry, or irritable  fever with increased sweating  hallucination, loss of contact with reality  seizures  stiff muscles  suicidal thoughts or other mood changes  tingling, pain, or numbness in the feet or hands  trouble passing urine or change in the amount of urine  trouble sleeping  unusually weak or tired  vomiting  yellowing of the eyes or skin Side effects that usually do not require medical attention (report to your doctor or health care professional if they continue or are bothersome):  change in sex drive or performance  change in appetite or weight  constipation  dizziness  dry mouth  nausea  tired  tremors  upset stomach This list may not describe all possible side effects. Call your doctor for medical advice about side effects. You may report  side effects to FDA at 1-800-FDA-1088. Where should I keep my medicine? Keep out of the reach of children. Store at room temperature between 15 and 30 degrees C (59 and 86 degrees F). Keep container tightly closed. Throw away any unused medicine after the expiration date. NOTE: This sheet is a summary. It may not cover all possible information. If you have questions about this medicine, talk to your doctor, pharmacist, or health care provider.  2021 [employer]vier/Gold Standard (2020-05-19 15:25:34)

## 2021-01-14 ENCOUNTER — Encounter: Payer: Self-pay | Admitting: Adult Health

## 2021-01-14 MED ORDER — NORTRIPTYLINE HCL 10 MG PO CAPS: 10.0000 mg | ORAL_CAPSULE | Freq: Every day | ORAL | 1 refills | Status: AC

## 2021-01-26 DIAGNOSIS — Z23 Encounter for immunization: Secondary | ICD-10-CM | POA: Diagnosis not present

## 2021-01-26 DIAGNOSIS — Z0000 Encounter for general adult medical examination without abnormal findings: Secondary | ICD-10-CM | POA: Diagnosis not present

## 2021-07-21 ENCOUNTER — Ambulatory Visit: Payer: BC Managed Care – PPO | Admitting: Adult Health

## 2021-09-15 DIAGNOSIS — L03113 Cellulitis of right upper limb: Secondary | ICD-10-CM | POA: Diagnosis not present

## 2022-02-08 DIAGNOSIS — J358 Other chronic diseases of tonsils and adenoids: Secondary | ICD-10-CM | POA: Diagnosis not present

## 2022-02-08 DIAGNOSIS — H1045 Other chronic allergic conjunctivitis: Secondary | ICD-10-CM | POA: Diagnosis not present

## 2022-02-08 DIAGNOSIS — J301 Allergic rhinitis due to pollen: Secondary | ICD-10-CM | POA: Diagnosis not present

## 2022-02-08 DIAGNOSIS — G43709 Chronic migraine without aura, not intractable, without status migrainosus: Secondary | ICD-10-CM | POA: Diagnosis not present

## 2022-02-17 DIAGNOSIS — R42 Dizziness and giddiness: Secondary | ICD-10-CM | POA: Diagnosis not present

## 2022-02-17 DIAGNOSIS — H81399 Other peripheral vertigo, unspecified ear: Secondary | ICD-10-CM | POA: Diagnosis not present

## 2022-03-21 DIAGNOSIS — H818X9 Other disorders of vestibular function, unspecified ear: Secondary | ICD-10-CM | POA: Diagnosis not present

## 2022-03-21 DIAGNOSIS — R200 Anesthesia of skin: Secondary | ICD-10-CM | POA: Diagnosis not present

## 2022-05-19 DIAGNOSIS — F4389 Other reactions to severe stress: Secondary | ICD-10-CM | POA: Diagnosis not present

## 2022-05-25 DIAGNOSIS — F4389 Other reactions to severe stress: Secondary | ICD-10-CM | POA: Diagnosis not present

## 2022-06-13 DIAGNOSIS — Z01419 Encounter for gynecological examination (general) (routine) without abnormal findings: Secondary | ICD-10-CM | POA: Diagnosis not present

## 2022-09-28 DIAGNOSIS — J0191 Acute recurrent sinusitis, unspecified: Secondary | ICD-10-CM | POA: Diagnosis not present

## 2022-09-28 DIAGNOSIS — Z6824 Body mass index (BMI) 24.0-24.9, adult: Secondary | ICD-10-CM | POA: Diagnosis not present

## 2023-02-20 DIAGNOSIS — R1032 Left lower quadrant pain: Secondary | ICD-10-CM | POA: Diagnosis not present

## 2023-02-20 DIAGNOSIS — N926 Irregular menstruation, unspecified: Secondary | ICD-10-CM | POA: Diagnosis not present

## 2023-02-20 DIAGNOSIS — Z3009 Encounter for other general counseling and advice on contraception: Secondary | ICD-10-CM | POA: Diagnosis not present

## 2023-04-24 DIAGNOSIS — R1030 Lower abdominal pain, unspecified: Secondary | ICD-10-CM | POA: Diagnosis not present

## 2023-04-24 DIAGNOSIS — Z3202 Encounter for pregnancy test, result negative: Secondary | ICD-10-CM | POA: Diagnosis not present

## 2023-04-24 DIAGNOSIS — N926 Irregular menstruation, unspecified: Secondary | ICD-10-CM | POA: Diagnosis not present

## 2023-04-24 DIAGNOSIS — R102 Pelvic and perineal pain: Secondary | ICD-10-CM | POA: Diagnosis not present

## 2023-10-10 DIAGNOSIS — J029 Acute pharyngitis, unspecified: Secondary | ICD-10-CM | POA: Diagnosis not present
